# Patient Record
Sex: Female | Born: 1935 | Race: White | Hispanic: No | Marital: Married
Health system: Southern US, Community
[De-identification: ages and names within clinical notes are randomized; demographics above are authoritative.]

---

## 2003-11-12 ENCOUNTER — Ambulatory Visit: Payer: Self-pay | Admitting: General Surgery

## 2004-08-24 ENCOUNTER — Ambulatory Visit: Payer: Self-pay | Admitting: Internal Medicine

## 2005-09-28 ENCOUNTER — Ambulatory Visit: Payer: Self-pay | Admitting: Internal Medicine

## 2005-10-11 ENCOUNTER — Ambulatory Visit: Payer: Self-pay | Admitting: Internal Medicine

## 2005-10-28 ENCOUNTER — Ambulatory Visit: Payer: Self-pay | Admitting: Internal Medicine

## 2005-11-11 ENCOUNTER — Ambulatory Visit: Payer: Self-pay | Admitting: Internal Medicine

## 2006-05-06 ENCOUNTER — Emergency Department: Payer: Self-pay | Admitting: Emergency Medicine

## 2006-05-06 ENCOUNTER — Other Ambulatory Visit: Payer: Self-pay

## 2006-07-04 ENCOUNTER — Emergency Department: Payer: Self-pay | Admitting: Emergency Medicine

## 2006-12-01 ENCOUNTER — Ambulatory Visit: Payer: Self-pay | Admitting: Family Medicine

## 2007-11-21 ENCOUNTER — Inpatient Hospital Stay: Payer: Self-pay | Admitting: Internal Medicine

## 2007-12-17 ENCOUNTER — Ambulatory Visit: Payer: Self-pay | Admitting: Family Medicine

## 2008-02-10 ENCOUNTER — Emergency Department: Payer: Self-pay | Admitting: Emergency Medicine

## 2008-05-02 ENCOUNTER — Encounter: Payer: Self-pay | Admitting: Internal Medicine

## 2008-05-11 ENCOUNTER — Encounter: Payer: Self-pay | Admitting: Internal Medicine

## 2008-06-11 ENCOUNTER — Encounter: Payer: Self-pay | Admitting: Internal Medicine

## 2008-12-03 ENCOUNTER — Ambulatory Visit: Payer: Self-pay | Admitting: Family Medicine

## 2009-11-18 ENCOUNTER — Emergency Department (HOSPITAL_COMMUNITY): Admission: EM | Admit: 2009-11-18 | Discharge: 2009-11-18 | Payer: Self-pay | Admitting: Emergency Medicine

## 2010-03-04 ENCOUNTER — Emergency Department: Payer: Self-pay | Admitting: Unknown Physician Specialty

## 2010-07-14 ENCOUNTER — Ambulatory Visit: Payer: Self-pay | Admitting: Internal Medicine

## 2010-09-01 ENCOUNTER — Ambulatory Visit: Payer: Self-pay | Admitting: Cardiology

## 2010-09-03 ENCOUNTER — Ambulatory Visit: Payer: Self-pay | Admitting: Cardiology

## 2010-12-25 ENCOUNTER — Emergency Department: Payer: Self-pay | Admitting: Emergency Medicine

## 2011-06-07 ENCOUNTER — Ambulatory Visit: Payer: Self-pay | Admitting: Family Medicine

## 2011-06-14 ENCOUNTER — Ambulatory Visit: Payer: Self-pay | Admitting: General Practice

## 2011-06-14 LAB — CBC
MCHC: 33.6 g/dL (ref 32.0–36.0)
MCV: 95 fL (ref 80–100)
Platelet: 149 10*3/uL — ABNORMAL LOW (ref 150–440)
RBC: 4.27 10*6/uL (ref 3.80–5.20)
WBC: 5.1 10*3/uL (ref 3.6–11.0)

## 2011-06-14 LAB — MRSA PCR SCREENING

## 2011-06-14 LAB — URINALYSIS, COMPLETE
Bilirubin,UR: NEGATIVE
Blood: NEGATIVE
Ketone: NEGATIVE
Protein: NEGATIVE
RBC,UR: 2 /HPF (ref 0–5)
Squamous Epithelial: 1
WBC UR: 5 /HPF (ref 0–5)

## 2011-06-14 LAB — BASIC METABOLIC PANEL
BUN: 11 mg/dL (ref 7–18)
Calcium, Total: 9.3 mg/dL (ref 8.5–10.1)
Co2: 33 mmol/L — ABNORMAL HIGH (ref 21–32)
Sodium: 143 mmol/L (ref 136–145)

## 2011-06-14 LAB — PROTIME-INR
INR: 1
Prothrombin Time: 13.9 secs (ref 11.5–14.7)

## 2011-06-14 LAB — SEDIMENTATION RATE: Erythrocyte Sed Rate: 16 mm/hr (ref 0–30)

## 2011-06-15 LAB — URINE CULTURE

## 2011-07-28 ENCOUNTER — Inpatient Hospital Stay: Payer: Self-pay | Admitting: Physician Assistant

## 2011-07-29 LAB — PLATELET COUNT: Platelet: 101 x10 3/mm 3 — ABNORMAL LOW

## 2011-07-29 LAB — BASIC METABOLIC PANEL WITH GFR
Anion Gap: 6 — ABNORMAL LOW
BUN: 13 mg/dL
Calcium, Total: 8.3 mg/dL — ABNORMAL LOW
Chloride: 107 mmol/L
Co2: 30 mmol/L
Creatinine: 0.82 mg/dL
EGFR (African American): >60
EGFR (Non-African Amer.): >60
Glucose: 169 mg/dL — ABNORMAL HIGH
Osmolality: 289
Potassium: 4.5 mmol/L
Sodium: 143 mmol/L

## 2011-07-29 LAB — HEMOGLOBIN: HGB: 10.3 g/dL — ABNORMAL LOW

## 2011-07-30 LAB — BASIC METABOLIC PANEL
Anion Gap: 7 (ref 7–16)
BUN: 11 mg/dL (ref 7–18)
Chloride: 105 mmol/L (ref 98–107)
Co2: 27 mmol/L (ref 21–32)
Creatinine: 0.62 mg/dL (ref 0.60–1.30)
Potassium: 3.5 mmol/L (ref 3.5–5.1)
Sodium: 139 mmol/L (ref 136–145)

## 2011-07-30 LAB — HEMOGLOBIN: HGB: 9.2 g/dL — ABNORMAL LOW (ref 12.0–16.0)

## 2011-07-30 LAB — PLATELET COUNT: Platelet: 100 10*3/uL — ABNORMAL LOW (ref 150–440)

## 2011-12-16 ENCOUNTER — Ambulatory Visit: Payer: Self-pay | Admitting: Neurology

## 2012-04-23 ENCOUNTER — Ambulatory Visit: Payer: Self-pay | Admitting: Physician Assistant

## 2012-08-18 IMAGING — US ULTRASOUND RIGHT BREAST
1 series · 12 of 12 positions shown · non-contrast
Comparison: 12/01/2006, 08/24/2004.

REASON FOR EXAM: RT BR DISCHARGE AND YRLY
COMMENTS:

PROCEDURE:     US  - US BREAST RIGHT  - June 07, 2011  [DATE]
RESULT:

[Series 1: ultrasound right breast · 0.09mm/px · 12 of 12 slices shown]
[im 1/12]
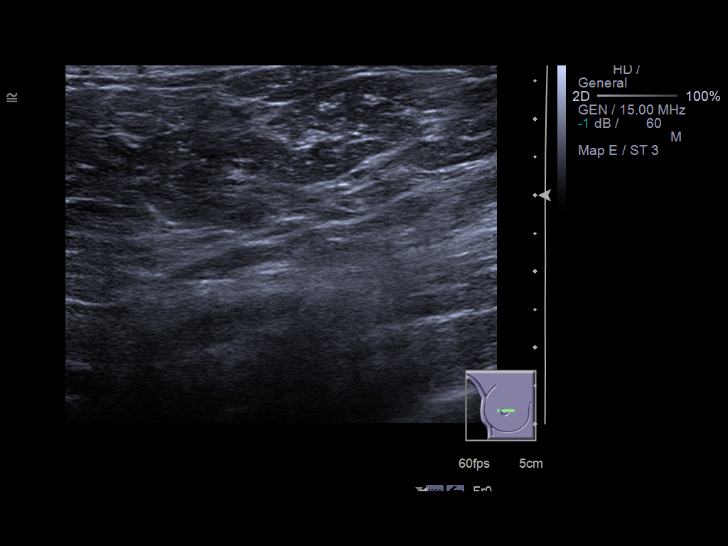
[im 2/12]
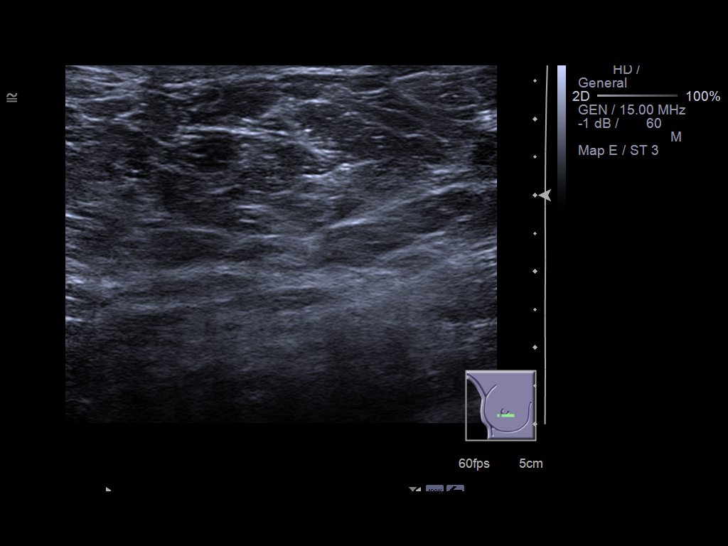
[im 3/12]
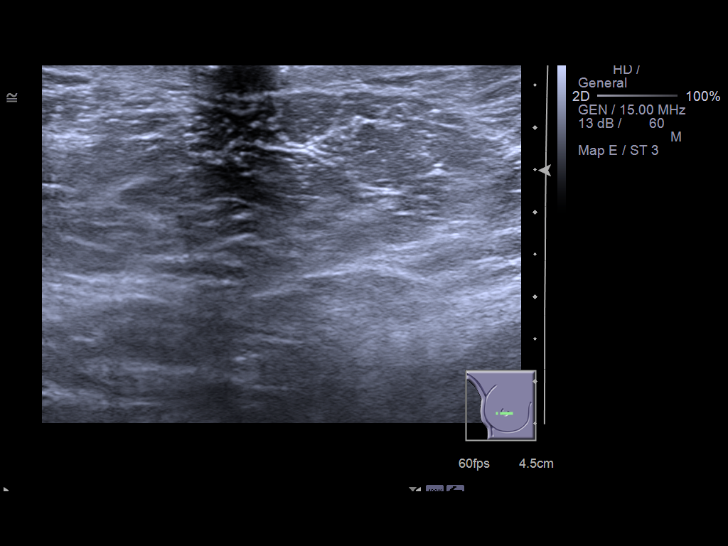
[im 4/12]
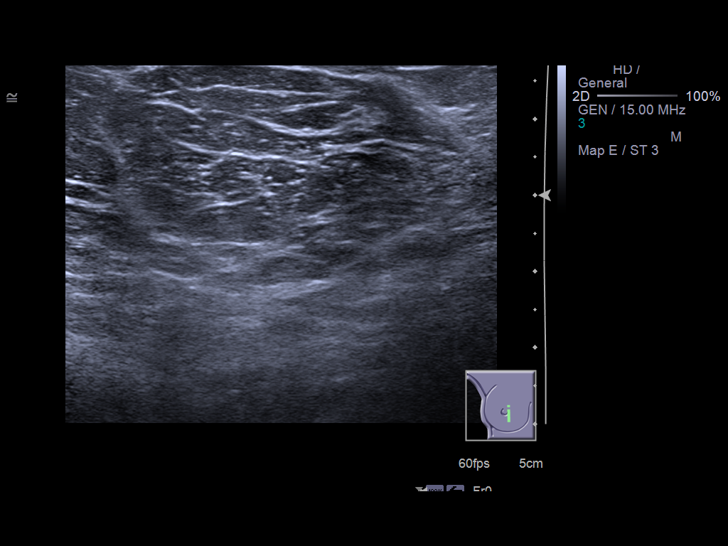
[im 5/12]
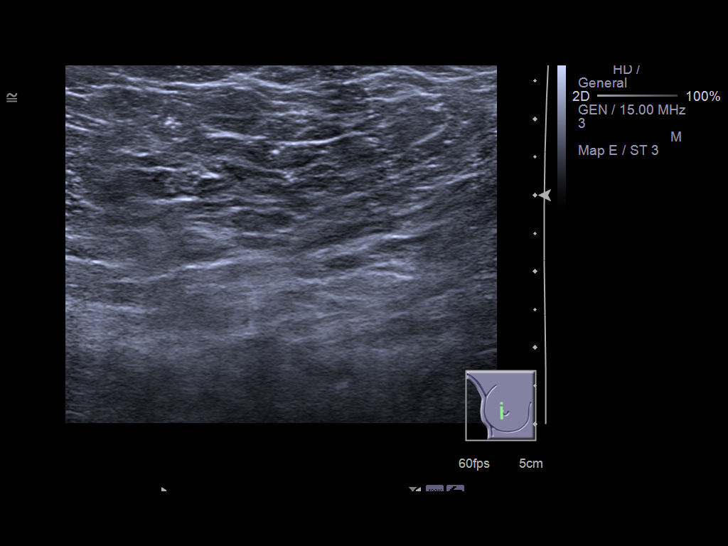
[im 6/12]
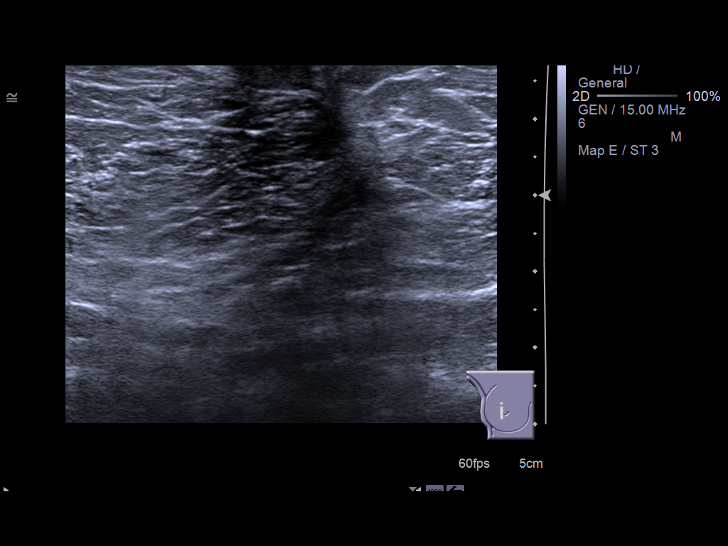
[im 7/12]
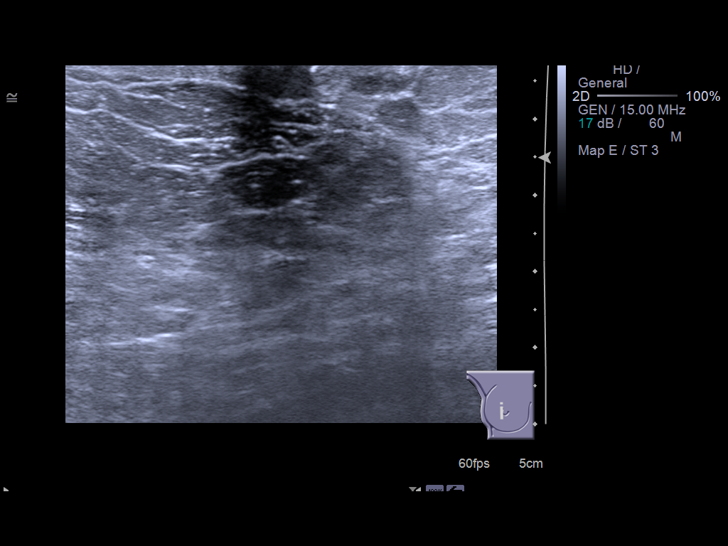
[im 8/12]
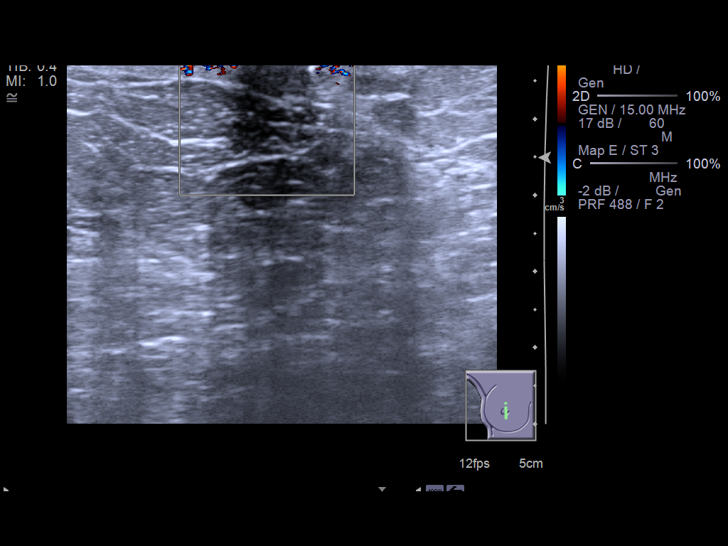
[im 9/12]
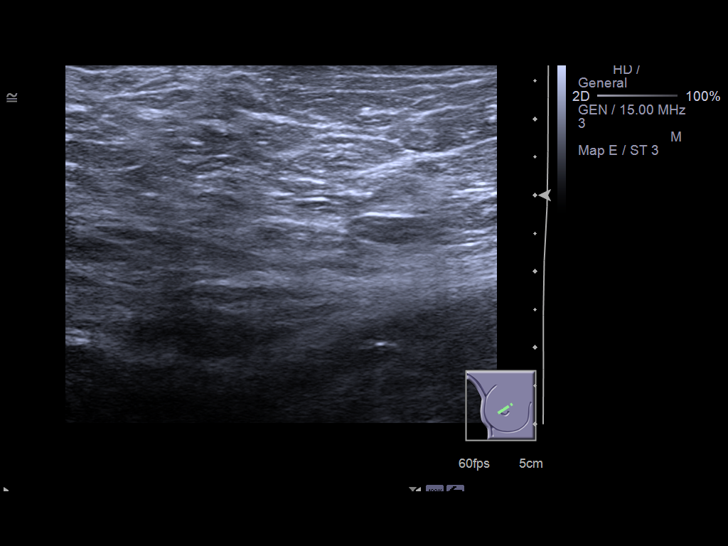
[im 10/12]
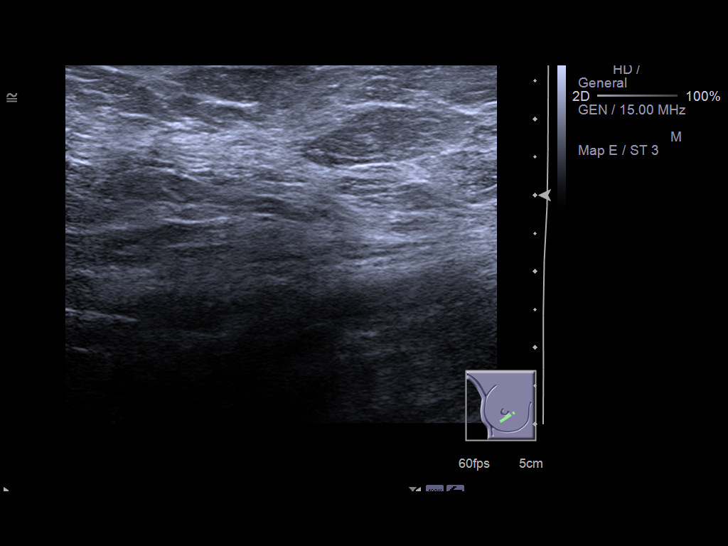
[im 11/12]
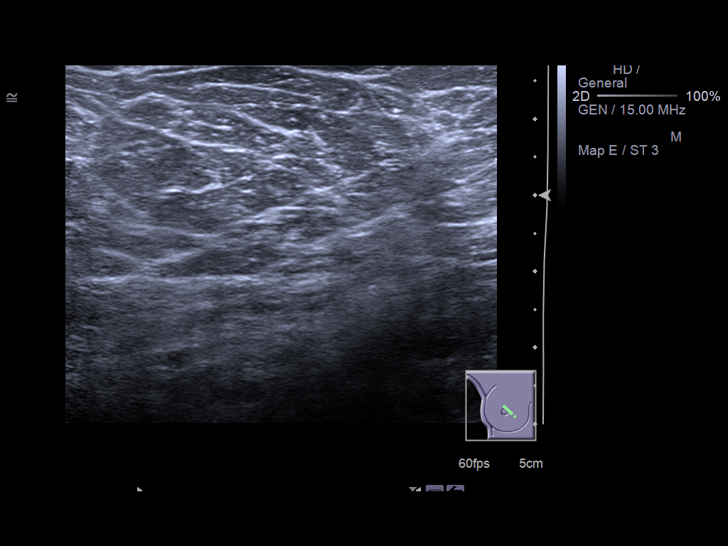
[im 12/12]
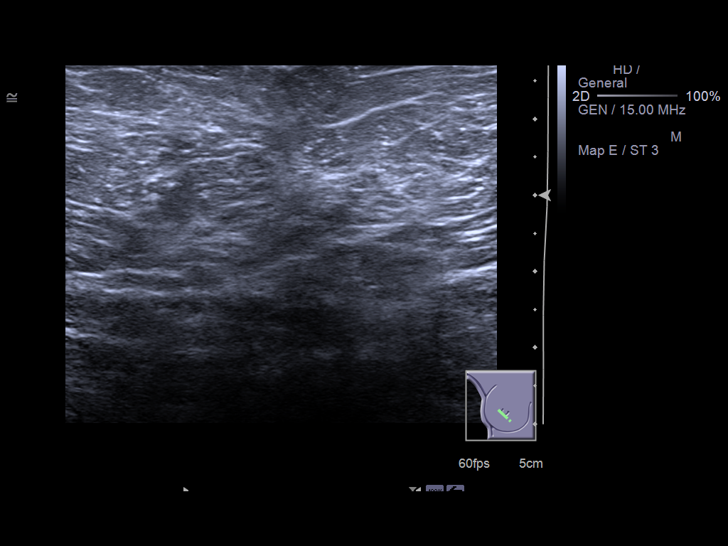

[12 of 12 positions shown; findings below may reference images not displayed]

FINDINGS: There is scattered fibroglandular tissue. A biopsy marker is present in the
lateral right breast. No suspicious masses or calcifications are identified.
No areas of architectural distortion.

Spot compression magnification views were performed of the retroareolar
right breast secondary to reported right nipple discharge. No mass or focal
asymmetry was identified.

Real-time ultrasound was performed of the retroareolar region of the right
breast. No mass or suspicious shadowing was identified. There is normal
shadowing from the nipple.
IMPRESSION: 1.  There is no sonographic or mammographic evidence of malignancy.
Recommend further evaluation and management of the reported right breast
nipple discharge be based on clinical grounds.
2.  Recommend continued annual screening mammography.

BI-RADS: Category 1 -Negative

## 2012-10-16 ENCOUNTER — Emergency Department: Payer: Self-pay | Admitting: Emergency Medicine

## 2012-10-16 LAB — COMPREHENSIVE METABOLIC PANEL WITH GFR
Albumin: 2.9 g/dL — ABNORMAL LOW
Alkaline Phosphatase: 106 U/L
Anion Gap: 4 — ABNORMAL LOW
BUN: 20 mg/dL — ABNORMAL HIGH
Bilirubin,Total: 1 mg/dL
Calcium, Total: 8.8 mg/dL
Chloride: 101 mmol/L
Co2: 30 mmol/L
Creatinine: 0.95 mg/dL
EGFR (African American): >60
EGFR (Non-African Amer.): 57 — ABNORMAL LOW
Glucose: 118 mg/dL — ABNORMAL HIGH
Osmolality: 274
Potassium: 4.5 mmol/L
SGOT(AST): 42 U/L — ABNORMAL HIGH
SGPT (ALT): 18 U/L
Sodium: 135 mmol/L — ABNORMAL LOW
Total Protein: 6.9 g/dL

## 2012-10-16 LAB — URINALYSIS, COMPLETE
Glucose,UR: NEGATIVE mg/dL (ref 0–75)
Ph: 5 (ref 4.5–8.0)
Protein: 30
RBC,UR: 3 /HPF (ref 0–5)
Specific Gravity: 1.023 (ref 1.003–1.030)
WBC UR: 4 /HPF (ref 0–5)

## 2012-10-16 LAB — CBC
HCT: 35.7 % (ref 35.0–47.0)
MCH: 31.4 pg (ref 26.0–34.0)
MCV: 93 fL (ref 80–100)
Platelet: 196 10*3/uL (ref 150–440)
RDW: 14.7 % — ABNORMAL HIGH (ref 11.5–14.5)
WBC: 7.2 10*3/uL (ref 3.6–11.0)

## 2012-10-19 ENCOUNTER — Inpatient Hospital Stay: Payer: Self-pay | Admitting: Internal Medicine

## 2012-10-19 LAB — CBC
HCT: 34.1 % — ABNORMAL LOW (ref 35.0–47.0)
MCH: 31.9 pg (ref 26.0–34.0)
MCHC: 34.7 g/dL (ref 32.0–36.0)
MCV: 92 fL (ref 80–100)
Platelet: 214 10*3/uL (ref 150–440)
RDW: 14.8 % — ABNORMAL HIGH (ref 11.5–14.5)

## 2012-10-19 LAB — COMPREHENSIVE METABOLIC PANEL
Anion Gap: 6 — ABNORMAL LOW (ref 7–16)
BUN: 34 mg/dL — ABNORMAL HIGH (ref 7–18)
Bilirubin,Total: 1.2 mg/dL — ABNORMAL HIGH (ref 0.2–1.0)
Calcium, Total: 8.9 mg/dL (ref 8.5–10.1)
EGFR (African American): 21 — ABNORMAL LOW
EGFR (Non-African Amer.): 18 — ABNORMAL LOW
Glucose: 105 mg/dL — ABNORMAL HIGH (ref 65–99)
SGOT(AST): 45 U/L — ABNORMAL HIGH (ref 15–37)
SGPT (ALT): 19 U/L (ref 12–78)

## 2012-10-19 LAB — URINALYSIS, COMPLETE
Ketone: NEGATIVE
Nitrite: NEGATIVE
RBC,UR: 4 /HPF (ref 0–5)
Specific Gravity: 1.025 (ref 1.003–1.030)
Squamous Epithelial: 4
WBC UR: 7 /HPF (ref 0–5)

## 2012-10-20 LAB — CBC WITH DIFFERENTIAL/PLATELET
Basophil #: 0 10*3/uL
Basophil %: 0.3 %
Eosinophil #: 0 10*3/uL
Eosinophil %: 0.4 %
HCT: 30.9 % — ABNORMAL LOW
HGB: 10.6 g/dL — ABNORMAL LOW
Lymphocyte %: 7.1 %
Lymphs Abs: 0.6 10*3/uL — ABNORMAL LOW
MCH: 31.5 pg
MCHC: 34.5 g/dL
MCV: 91 fL
Monocyte #: 0.5 10*3/uL
Monocyte %: 6.1 %
Neutrophil #: 7.1 10*3/uL — ABNORMAL HIGH
Neutrophil %: 86.1 %
Platelet: 202 10*3/uL
RBC: 3.38 X10 6/mm 3 — ABNORMAL LOW
RDW: 14.2 %
WBC: 8.2 10*3/uL

## 2012-10-20 LAB — COMPREHENSIVE METABOLIC PANEL WITH GFR
Albumin: 2.3 g/dL — ABNORMAL LOW
Alkaline Phosphatase: 120 U/L
Anion Gap: 7
BUN: 32 mg/dL — ABNORMAL HIGH
Bilirubin,Total: 0.9 mg/dL
Calcium, Total: 8.4 mg/dL — ABNORMAL LOW
Chloride: 101 mmol/L
Co2: 25 mmol/L
Creatinine: 1.32 mg/dL — ABNORMAL HIGH
EGFR (African American): 44 — ABNORMAL LOW
EGFR (Non-African Amer.): 38 — ABNORMAL LOW
Glucose: 108 mg/dL — ABNORMAL HIGH
Osmolality: 274
Potassium: 4.7 mmol/L
SGOT(AST): 39 U/L — ABNORMAL HIGH
SGPT (ALT): 15 U/L
Sodium: 133 mmol/L — ABNORMAL LOW
Total Protein: 6 g/dL — ABNORMAL LOW

## 2012-10-20 LAB — CA 125: CA 125: 468.2 U/mL — ABNORMAL HIGH (ref 0.0–34.0)

## 2012-10-22 LAB — BASIC METABOLIC PANEL
BUN: 13 mg/dL (ref 7–18)
Calcium, Total: 9 mg/dL (ref 8.5–10.1)
Co2: 29 mmol/L (ref 21–32)
EGFR (African American): >60
EGFR (Non-African Amer.): >60
Potassium: 4.4 mmol/L (ref 3.5–5.1)

## 2012-10-24 ENCOUNTER — Ambulatory Visit: Payer: Self-pay | Admitting: Gynecologic Oncology

## 2012-11-11 ENCOUNTER — Ambulatory Visit: Payer: Self-pay | Admitting: Gynecologic Oncology

## 2012-11-28 ENCOUNTER — Inpatient Hospital Stay: Payer: Self-pay | Admitting: Internal Medicine

## 2012-11-28 LAB — BASIC METABOLIC PANEL
BUN: 81 mg/dL — ABNORMAL HIGH (ref 7–18)
EGFR (African American): 26 — ABNORMAL LOW
EGFR (Non-African Amer.): 22 — ABNORMAL LOW
Osmolality: 279 (ref 275–301)
Potassium: 3.2 mmol/L — ABNORMAL LOW (ref 3.5–5.1)
Sodium: 126 mmol/L — ABNORMAL LOW (ref 136–145)

## 2012-11-28 LAB — CBC
HCT: 38.4 % (ref 35.0–47.0)
MCH: 30.6 pg (ref 26.0–34.0)
MCHC: 34.2 g/dL (ref 32.0–36.0)
RBC: 4.3 10*6/uL (ref 3.80–5.20)

## 2012-11-28 LAB — PRO B NATRIURETIC PEPTIDE: B-Type Natriuretic Peptide: 2456 pg/mL — ABNORMAL HIGH (ref 0–450)

## 2012-11-28 LAB — TROPONIN I
Troponin-I: <0.02 ng/mL
Troponin-I: <0.02 ng/mL
Troponin-I: <0.02 ng/mL

## 2012-11-29 LAB — BASIC METABOLIC PANEL
Anion Gap: 9 (ref 7–16)
BUN: 78 mg/dL — ABNORMAL HIGH (ref 7–18)
Chloride: 82 mmol/L — ABNORMAL LOW (ref 98–107)
EGFR (African American): 27 — ABNORMAL LOW
EGFR (Non-African Amer.): 24 — ABNORMAL LOW
Glucose: 127 mg/dL — ABNORMAL HIGH (ref 65–99)
Osmolality: 284 (ref 275–301)
Sodium: 129 mmol/L — ABNORMAL LOW (ref 136–145)

## 2012-11-29 LAB — CBC WITH DIFFERENTIAL/PLATELET
Basophil %: 0.2 %
Eosinophil #: 0.1 10*3/uL (ref 0.0–0.7)
HCT: 34.2 % — ABNORMAL LOW (ref 35.0–47.0)
HGB: 11.7 g/dL — ABNORMAL LOW (ref 12.0–16.0)
Lymphocyte #: 0.7 10*3/uL — ABNORMAL LOW (ref 1.0–3.6)
Monocyte #: 0.7 x10 3/mm (ref 0.2–0.9)
Neutrophil %: 88.2 %
Platelet: 170 10*3/uL (ref 150–440)
RBC: 3.85 10*6/uL (ref 3.80–5.20)
RDW: 16.5 % — ABNORMAL HIGH (ref 11.5–14.5)

## 2012-11-29 LAB — APTT: Activated PTT: 113.7 secs — ABNORMAL HIGH (ref 23.6–35.9)

## 2012-11-30 LAB — APTT: Activated PTT: 103.1 secs — ABNORMAL HIGH (ref 23.6–35.9)

## 2012-12-01 LAB — PLATELET COUNT: Platelet: 181 10*3/uL (ref 150–440)

## 2012-12-01 LAB — BASIC METABOLIC PANEL
BUN: 73 mg/dL — ABNORMAL HIGH (ref 7–18)
Chloride: 90 mmol/L — ABNORMAL LOW (ref 98–107)
EGFR (African American): 28 — ABNORMAL LOW
EGFR (Non-African Amer.): 24 — ABNORMAL LOW
Glucose: 110 mg/dL — ABNORMAL HIGH (ref 65–99)
Osmolality: 283 (ref 275–301)
Potassium: 4.1 mmol/L (ref 3.5–5.1)
Sodium: 130 mmol/L — ABNORMAL LOW (ref 136–145)

## 2012-12-01 LAB — MAGNESIUM: Magnesium: 2.3 mg/dL

## 2012-12-02 LAB — BASIC METABOLIC PANEL
Anion Gap: 5 — ABNORMAL LOW (ref 7–16)
BUN: 70 mg/dL — ABNORMAL HIGH (ref 7–18)
Creatinine: 1.8 mg/dL — ABNORMAL HIGH (ref 0.60–1.30)
EGFR (African American): 30 — ABNORMAL LOW
EGFR (Non-African Amer.): 26 — ABNORMAL LOW
Osmolality: 286 (ref 275–301)

## 2012-12-03 LAB — CULTURE, BLOOD (SINGLE)

## 2012-12-11 ENCOUNTER — Ambulatory Visit: Payer: Self-pay | Admitting: Gynecologic Oncology

## 2013-01-11 DEATH — deceased

## 2013-12-31 IMAGING — CT CT HEAD WITHOUT CONTRAST
1 series · 16 of 30 positions shown, 20 images · non-contrast
Comparison: none

REASON FOR EXAM: generalized weakness, near-syncope
COMMENTS:

PROCEDURE:     CT  - CT HEAD WITHOUT CONTRAST  - October 19, 2012  [DATE]
RESULT:     Technique: Helical noncontrasted 5 mm sections were obtained
from the skull base through the vertex.

[Series 2: soft tissue · axial · 0.40mm/px · z∈[-66,+69]mm · 16 of 30 slices shown, 20 images]
[im 2/30  brain]
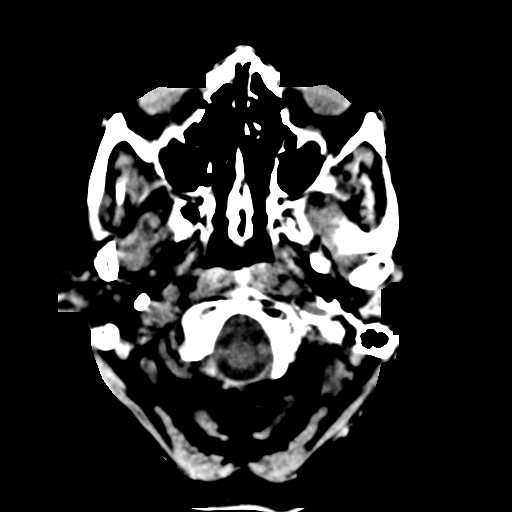
[im 2/30  bone]
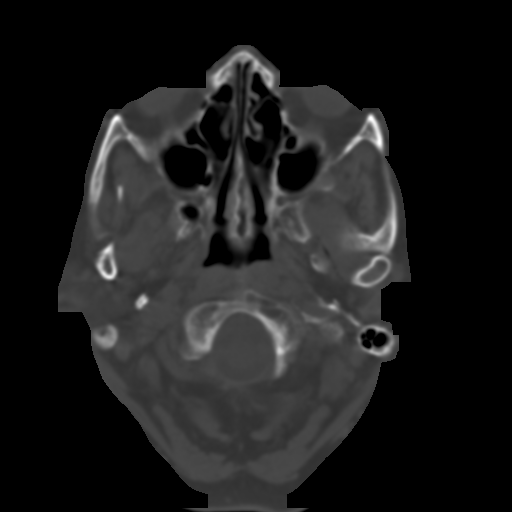
[im 4/30  brain]
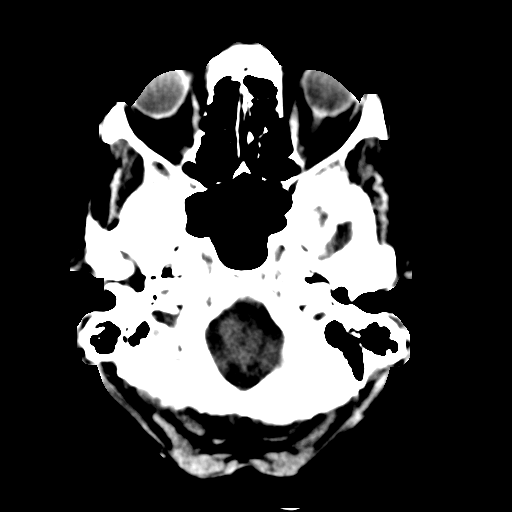
[im 6/30  brain]
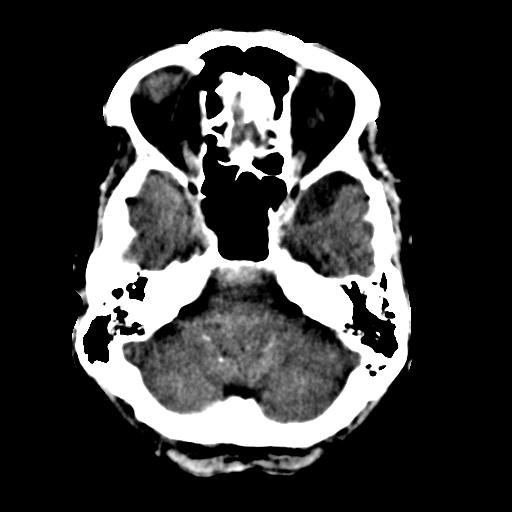
[im 8/30  brain]
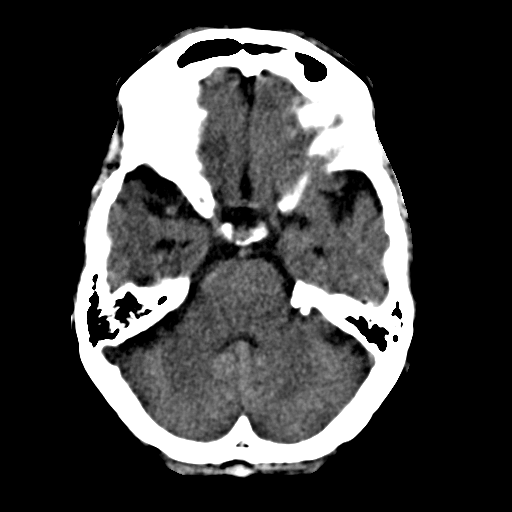
[im 9/30  brain]
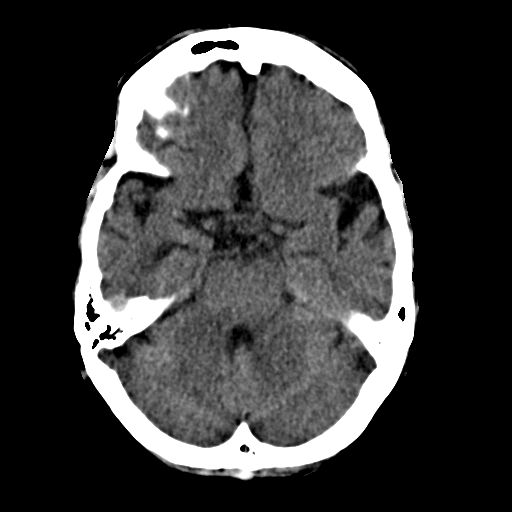
[im 9/30  bone]
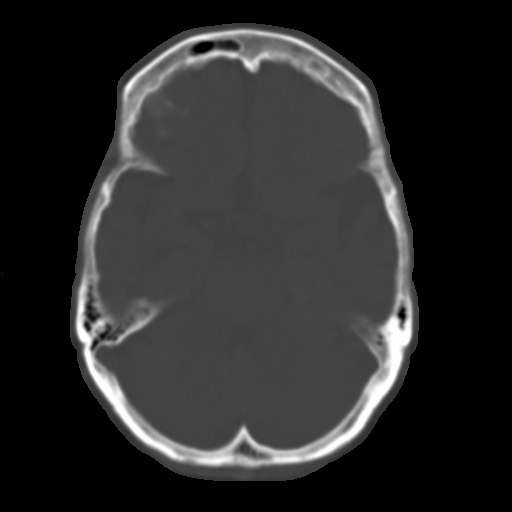
[im 11/30  brain]
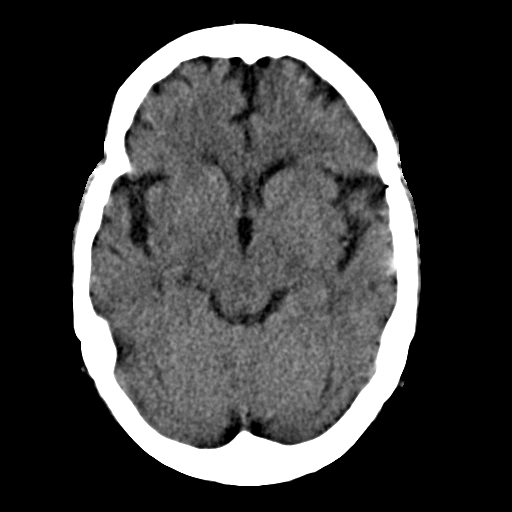
[im 13/30  brain]
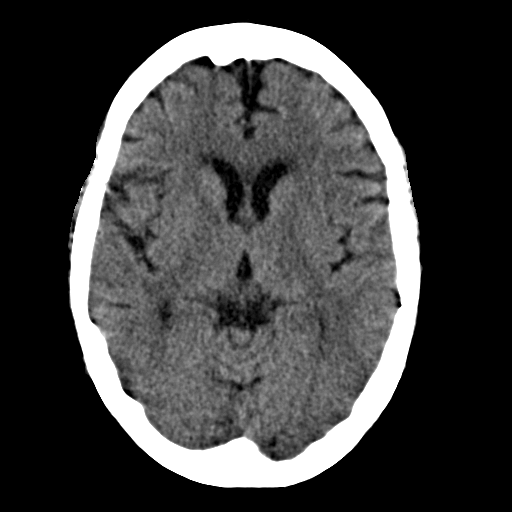
[im 15/30  brain]
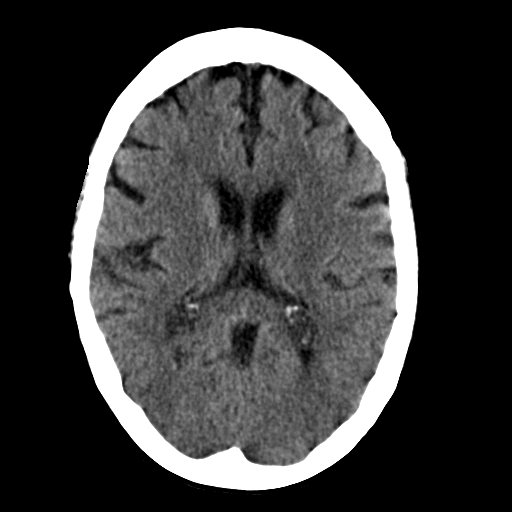
[im 16/30  brain]
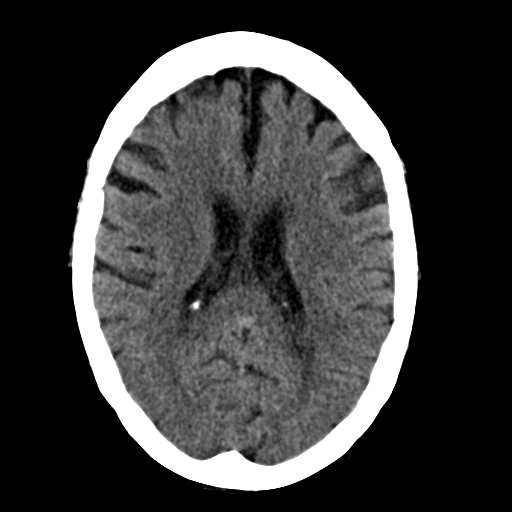
[im 16/30  bone]
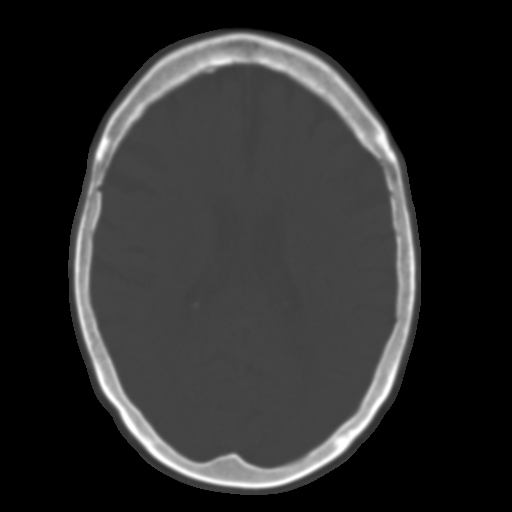
[im 18/30  brain]
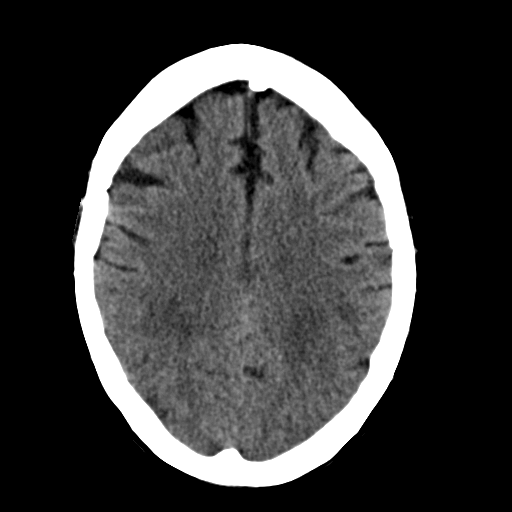
[im 20/30  brain]
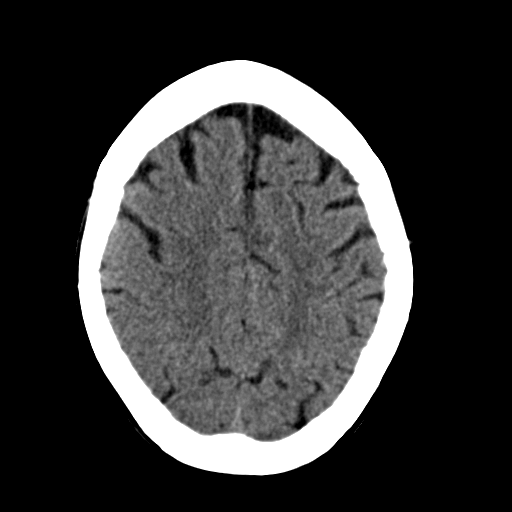
[im 22/30  brain]
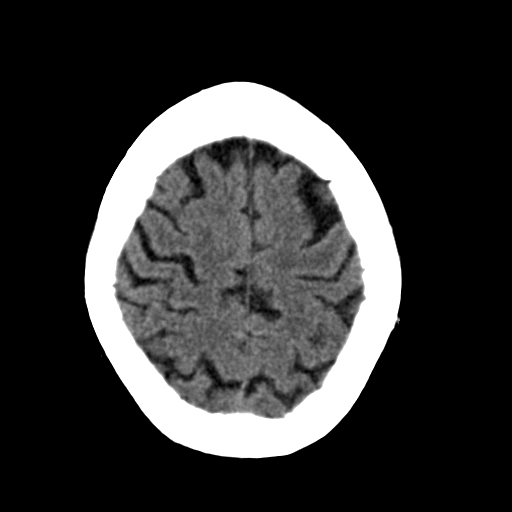
[im 23/30  brain]
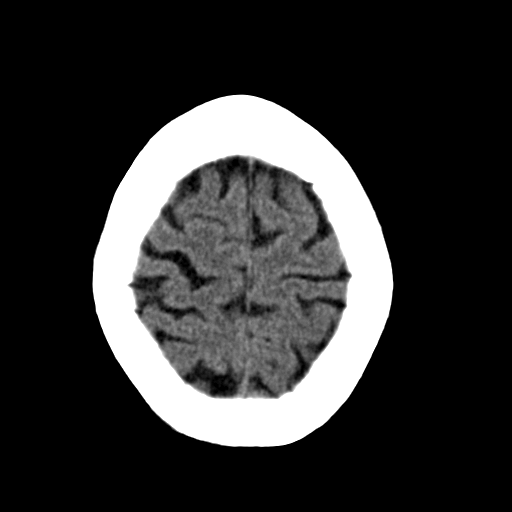
[im 23/30  bone]
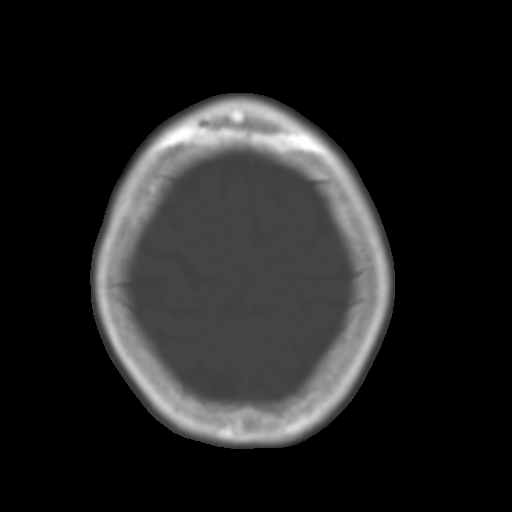
[im 25/30  brain]
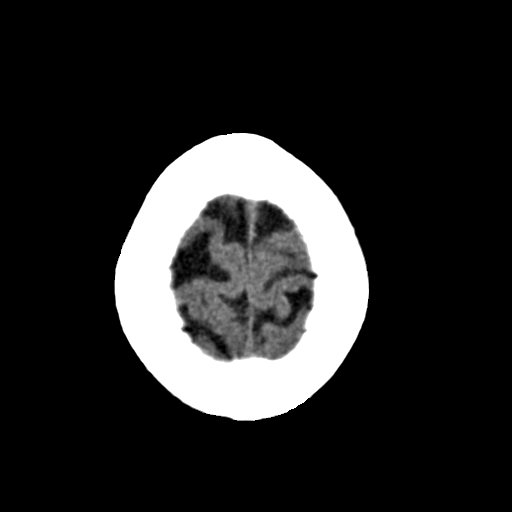
[im 27/30  brain]
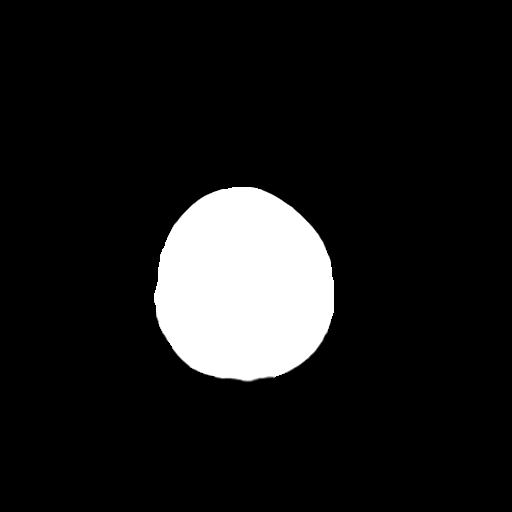
[im 29/30  brain]
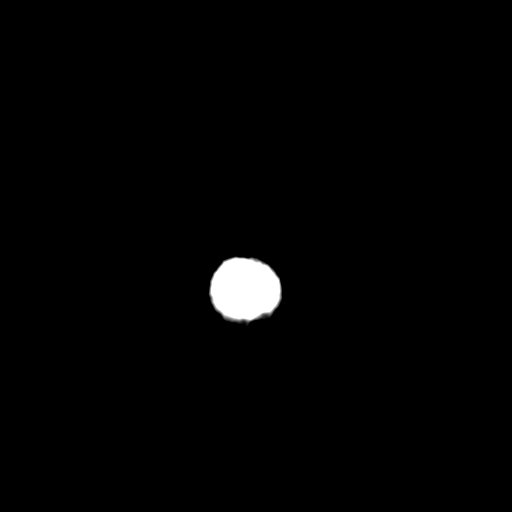

[16 of 30 positions shown; findings below may reference images not displayed]

FINDINGS: Diffuse cortical and cerebellar atrophy is identified as well as
diffuse areas of low attenuation within the subcortical, deep and
periventricular white matter regions. There is not evidence of intra-axial
nor extra-axial fluid collections, acute hemorrhage, mass effect, nor a
depressed skull fracture. The visualized paranasal sinuses and mastoid air
cells are patent.
IMPRESSION: Chronic and involutional changes without evidence of acute
abnormalities. If there is persistent clinical concern further evaluation
with MRI is recommended.
2. Comparison made to prior dated 12/16/2011

## 2014-04-30 NOTE — Discharge Summary (Signed)
PATIENT NAME:  Taylor Fox, Taylor Fox MR#:  [id number] DATE OF BIRTH:  November 12, 1929  DATE OF ADMISSION:  07/28/2011 DATE OF DISCHARGE:  08/01/2011  PRIMARY DIAGNOSIS: Degenerative arthrosis of the right knee.   SECONDARY DIAGNOSES:  1. Hypertension.  2. Hyperlipidemia.  3. Anxiety. 4. Restless leg syndrome. 5. Atrial fibrillation. 6. Stasis dermatitis. 7. Aortic valve replacement.   PROCEDURE PERFORMED: Right total knee arthroplasty using computer-assisted navigation.   BRIEF HISTORY OF PRESENT ILLNESS: The patient is a 81 year old female who has been seen for complaints of bilateral knee pain with right knee more symptomatic than the left. Pain has been aggravated by weight-bearing activities. She has had episodes of swelling as well as decrease in her range of motion. Prior to admission she was using a cane for ambulation due to the pain. She has had giving way of the knee. The pain had increased to the point that it was significantly interfering with her activities of daily living. X-rays of the right knee demonstrated severe degenerative changes in tricompartmental fashion with subcondylar sclerosis and osteophyte formation. After discussion of the risks and benefits of surgical intervention, the patient expressed her understanding of the risks, benefits, and agreed with plans for surgical intervention.   SUMMARY OF HOSPITAL COURSE: Patient underwent routine preoperative evaluation. On 07/28/2011 the patient was taken to the Operating Room at which time she underwent a right total knee arthroplasty using computer-assisted navigation under spinal anesthesia. Estimated blood loss was 200 mL. Fluids replaced included 1200 mL of crystalloid. Tourniquet time was 115 minutes. Implants utilized included DePuy PFC Sigma size 5 posterior stabilized femoral component (cemented), size 4 MBT tibial component (cemented), 38 mm three peg oval dome patella (cemented), and a 15 mm stabilized rotating platform  polyethylene insert. Soft tissue releases included anterior cruciate ligament, posterior cruciate ligament, deep medial collateral ligament, and patellofemoral ligament. The patient received perioperative antibiotics for prophylaxis. Postoperatively she received deep vein thrombosis prophylaxis in the form of Lovenox q.12 hours and early mobilization. TED stockings and AV-1 foot pumps could not be tolerated due to the patient's hypersensitivity associated with her stasis dermatitis.   Hemoglobin on postoperative day #1 was 10.3 with subsequent hemoglobin on postoperative day #2 of 9.2. The patient remained hemodynamically stable. Foley catheter and drain was discontinued on postoperative day #2. Surgical incision was healing well, although there was marked ecchymosis in the lower leg. This was felt to be primarily related to her dermatitis. Physical therapy and occupational therapy were consulted to begin total knee arthroplasty rehab protocol. The patient made slow but steady progress. She demonstrated range of motion of up to 78 degrees. She demonstrated easy fatigability with gait training but was ambulating approximately 6 feet at a time using a rolling walker with weight-bearing as tolerated. It was felt that the patient was medically and orthopedically stable. However, it was felt that she would benefit from additional physical therapy and occupational therapy in a skilled nursing environment.   DISPOSITION: The patient is discharged to [facility] skilled nursing facility in stable condition.   DIET: Regular as tolerated.   ACTIVITY: Patient may be weight-bearing as tolerated to the right lower extremity.   SPECIAL NURSING ORDERS: Incentive spirometry q.1 hour while awake. Polar Care to be continued to the right knee maintain a temperature between 40 and 50 degrees Fahrenheit.   CONSULTS: Physical therapy and occupational therapy are consulted to continue with total knee arthroplasty rehab protocol.    MEDICATIONS:  1. Tylenol 500 to 1000 mg q.4 hours  p.r.n.  2. Mylanta DS 30 mL q.6 hours p.r.n. indigestion. 3. Norvasc 5 mg orally daily.  4. Vitamin C 500 mg orally b.i.d.  5. Dulcolax suppository 10 mg per rectum daily as needed for constipation. 6. Senokot-S 1 tablet orally b.i.d.  7. Lovenox 40 mg sub-Q q.12 hours x14 days. 8. Lasix 40 mg orally daily.  9. Losartan 100 mg orally daily.  10. Lovastatin 20 mg orally daily.  11. MOM 30 mL orally b.i.d. p.r.n. constipation. 12. Omega-3 fatty acid 1 gram orally daily.  13. Artificial tears ophthalmic drops one drop both eyes q.4 hours p.r.n. dry eyes.  14. Oxycodone 5 to 10 mg orally q.4 hours p.r.n. pain.  15. Protonix 40 mg p.o. b.i.d.  16. Tramadol 50 mg to 100 mg orally q.4 hours p.r.n. pain.   FOLLOW UP: Patient is to return to the office for re-evaluation on 08/12/2011 at 8:45 a.m. to the evaluated by Van Clines, PA. Subsequent follow up is on 09/07/2011 at 1:30.   ____________________________ Illene Labrador. Angie Fava., MD jph:cms D: 08/01/2011 23:53:49 ET T: 08/02/2011 05:50:54 ET JOB#: [id number]  cc: Fayrene Fearing P. Angie Fava., MD, <Dictator> JAMES P Angie Fava MD ELECTRONICALLY SIGNED 08/02/2011 11:18

## 2014-04-30 NOTE — Discharge Summary (Signed)
PATIENT NAME:  Taylor DownerFLORENCE, Britzy G MR#:  [id number] DATE OF BIRTH:  13-Sep-1929  DATE OF ADMISSION:  07/28/2011 DATE OF DISCHARGE:  08/02/2011   ADDENDUM:  The patient was recovering from right total knee arthroplasty. She was unable to be discharged on July 21st because [facility] does not take admissions on Sundays. The patient, however, did need continued physical and occupational therapy treatments and supervised care. She was not ready to go home. She did have a stable course during her extra time here. She had tolerated her diet. On the 21st she passed some stool. She did work with physical therapy on the 21st and ambulated about 60 feet. Knee range of motion active assistive was 0 to 70 so not as good as we would like but, again, she was stable.   Her discharge medications remain the same. She will be on Lovenox 40 mg subcutaneous injections twice a day for 14 days from July 22nd and then this can be stopped. She also may take her home medicine, a cranberry capsule daily. Otherwise, her discharge medications and follow-up appointments on August 1st and 27th and everything else remains the same. Please [facility] for any questions.  ____________________________ Letta MoynahanJonathan R. Clyde CanterburyPrentice, GeorgiaPA jrp:drc D: 08/02/2011 08:32:25 ET T: 08/02/2011 08:56:13 ET JOB#: [id number]  cc: Letta MoynahanJonathan R. Clyde CanterburyPrentice, GeorgiaPA, <Dictator> Letta MoynahanJONATHAN R Desare Duddy PA ELECTRONICALLY SIGNED 08/04/2011 16:14

## 2014-04-30 NOTE — Op Note (Signed)
PATIENT NAME:  Taylor Fox, Taylor Fox MR#:  [id number] DATE OF BIRTH:  1929/09/29  DATE OF PROCEDURE:  07/28/2011  PREOPERATIVE DIAGNOSIS: Degenerative arthrosis of the right knee.   POSTOPERATIVE DIAGNOSIS: Degenerative arthrosis of the right knee.   PROCEDURE PERFORMED: Right total knee arthroplasty using computer-assisted navigation.   SURGEON: Illene LabradorJames P. Angie FavaHooten, Jr., MD   ASSISTANT: Van ClinesJon Wolfe, PA-C (required to maintain retraction throughout the procedure)   ANESTHESIA: Spinal.   ESTIMATED BLOOD LOSS: 200 mL.   FLUIDS REPLACED: 1200 mL of crystalloid.   TOURNIQUET TIME: 115 minutes.   DRAINS: Two medium drains to reinfusion system.   SOFT TISSUE RELEASES: Anterior cruciate ligament, posterior cruciate ligament, deep medial collateral ligament, and patellofemoral ligament.   IMPLANTS UTILIZED: DePuy PFC Sigma size 5 posterior stabilized femoral component (cemented), size 4 MBT tibial component (cemented), 38 mm three-peg oval dome patella (cemented), and a 15 mm stabilized rotating platform polyethylene insert.   INDICATIONS FOR SURGERY: The patient is a 81 year old female who has been seen for complaints of severe progressive right knee pain with flexion contracture and varus deformity. X-rays demonstrated severe degenerative changes in tricompartmental fashion with erosion noted medially. The patient has not seen any significant improvements despite conservative nonsurgical intervention. After discussion of the risks and benefits of surgical intervention, the patient expressed her understanding of the risks and benefits and agreed with plans for surgical intervention.  PROCEDURE IN DETAIL: The patient was brought into the operating room, and after adequate spinal anesthesia was achieved, a tourniquet was placed on the patient's upper right thigh. The patient's right knee and leg were cleaned and prepped with alcohol and DuraPrep and draped in the usual sterile fashion. A "timeout" was  performed as per usual protocol. The right lower extremity was exsanguinated using Esmarch, and the tourniquet was inflated to 300 mmHg. An anterior longitudinal incision was made followed by a standard mid vastus approach. A moderate effusion was evacuated. The deep fibers of the medial collateral ligament were elevated in a subperiosteal fashion off the medial flare of the tibia so as to maintain a continuous soft tissue sleeve. The patella was subluxed laterally and the patellofemoral ligament was incised.. Inspection of the knee demonstrated severe degenerative changes in all three compartments with full-thickness loss of articular cartilage and eburnated bone noted to the medial compartment. Prominent osteophytes were debrided using a rongeur. The remnant of the anterior and posterior cruciate ligaments were excised. Two 4.0 mm Schanz pins were inserted into the femur and into the tibia for attachment of the array of spheres used for computer-assisted navigation. Hip center was identified using circumduction technique. Distal landmarks were mapped using the computer. The distal femur and proximal tibia were mapped using the computer. The distal femoral cutting guide was positioned using computer-assisted navigation so as to achieve a 5-degrees distal valgus cut. Cut was performed and verified using the computer. A relatively poor bone quality was noted. The distal femur was sized, and it was felt that a size 5 femoral component was appreciated. A size 5 cutting guide was positioned and an anterior cut was performed and verified using the computer. This was followed by completion of the posterior and chamfer cuts. A femoral cutting guide for the central box was then positioned, and the central box cut was performed.   Attention was then directed to the proximal tibia. Medial and lateral menisci were excised. The extramedullary tibial cutting guide was positioned using computer-assisted navigation so as to  achieve 0-degree varus-valgus alignment and  0-degree posterior slope. Cut was performed and verified using the computer. The proximal tibia was sized, and it was felt that a size 4 tibial tray was appropriate. At this point, there was noted to be a skin tear along the inferior aspect of the incision site. A Velcro strap was placed over the area so as to prevent propagation of the tear. Tibial and femoral trials were inserted, followed by insertion of first a 10 and subsequently a 12.5 and eventually a 15 mm polyethylene insert. This allowed for excellent mediolateral soft tissue balancing both in extension and in flexion. Finally, the patella was cut and prepared so as to accommodate a 38 mm three-peg oval dome patella. A patellar trial was placed and the knee was placed through a range of motion with excellent patellar tracking appreciated. The femoral trial was removed after debridement of osteophytes from the posterior condyles. A central post hole for the tibial component was reamed, followed by insertion of a keel punch. The tibial trial was then removed. The cut surfaces of bone were irrigated with copious amounts of normal saline with antibiotic solution using pulsatile lavage and then suctioned dry. Polymethylmethacrylate cement with gentamicin was mixed in the usual sterile fashion using a vacuum mixer. Cement was applied to the cut surface of the proximal tibia as well as along the undersurface of a size 4 MBT tibial component. The tibial component was positioned and impacted into place. Excess cement was removed using freer elevators. Cement was then applied to the cut surface of the femur as well as along the posterior flanges of a size 5 posterior stabilized femoral component. The femoral component was positioned and impacted into place. Excess cement was removed using freer elevators. A 15 mm polyethylene trial was inserted and the knee was brought in full extension with steady axial compression  applied. Finally, cement was applied to the backside of a 38 mm three-peg oval dome patella, and the patellar component was positioned and a patellar clamp applied. Excess cement was removed using freer elevators.  After adequate curing of cement, the tourniquet was deflated after a total tourniquet time of 115 minutes. Hemostasis was achieved using electrocautery. The knee was irrigated with copious amounts of normal saline with antibiotic solution using pulsatile lavage and then suctioned dry. The knee was inspected for any residual cement debris; 30 mL of 0.25% Marcaine with epinephrine was injected along the posterior capsule. A 15 mm stabilized rotating platform polyethylene insert was inserted, and the knee was placed through a range of motion. Excellent patellar tracking was appreciated and excellent mediolateral soft tissue balancing was appreciated.   Two medium drains were placed in the wound bed and brought out through a separate stab incision to be attached to a reinfusion system. The medial parapatellar portion of the incision was reapproximated using interrupted sutures of #1 Vicryl. The subcutaneous tissue was approximated in layers first using 0 Vicryl, followed by 2-0 Vicryl. The area of the skin tear was carefully reapproximated using 2-0 Vicryl. Skin as well as skin tear was then reapproximated using skin staples. A sterile dressing was applied.   The patient tolerated the procedure well. She was transported to the recovery room in stable condition.   ____________________________ Illene Labrador. Angie Fava., MD jph:cbb D: 07/29/2011 00:32:50 ET T: 07/29/2011 10:46:19 ET JOB#: [id number]  cc: Fayrene Fearing P. Angie Fava., MD, <Dictator> JAMES P Angie Fava MD ELECTRONICALLY SIGNED 08/01/2011 8:13

## 2014-05-03 NOTE — H&P (Signed)
PATIENT NAME:  Taylor DownerFLORENCE, Taylor Fox MR#:  [id number] DATE OF BIRTH:  26-May-1929  DATE OF ADMISSION:  11/28/2012  PRIMARY CARE PHYSICIAN: Dr. Maryjane HurterFeldpausch, but the patient is currently at [facility].   CHIEF COMPLAINT: Sent in for low oxygen saturation.   HISTORY OF PRESENT ILLNESS: This is an 81 year old female who is a very poor historian, most of the history obtained from previous chart. She was sent in for low oxygen saturation. She does not complain of any shortness of breath. No chest pain. She does have a dry cough. She has been basically bedbound for the past month, very weak. In the ER, she did have a chest x-ray done which showed pleural effusions, bibasilar air space disease. Differential includes pneumonia, asymmetric edema or atelectasis. The patient also had a CT scan of the chest without contrast that showed a small moderate right pleural effusion, left basilar air space disease, patchy bilateral ground-glass opacities concerning for mild edema, and abdominal ascites. The patient was also found to be in acute renal failure, had an elevated white count, hyponatremia and hypokalemia. Hospitalist services were contacted for further evaluation.   PAST MEDICAL HISTORY: Hypertension, chronic atrial fibrillation, hyperlipidemia, coronary artery disease, congestive heart failure, neuropathy, left ovarian cystic mass, high-risk surgical candidate, dementia.   PAST SURGICAL HISTORY: Aortic valve replacement, coronary artery bypass graft, cystocele and rectocele repair, cholecystectomy, and pacemaker placement.   ALLERGIES: No known drug allergies.   SOCIAL HISTORY: Quit smoking 30 years ago. No alcohol. No drug use. Currently at the rehab.   FAMILY HISTORY: Father died after a fracture of the neck. Mother died of a stroke in her 3240s.   MEDICATIONS: Include Dulcolax suppository as needed for constipation, cranberry 1 tablet daily, Demadex 100 mg daily, fish oil 1200 mg daily, gabapentin 300  mg twice a day, multivitamin with minerals once a day, tramadol 50 mg every 6 hours as needed for pain, vitamin C 500 mg twice a day, Zofran 4 mg every 6 hours as needed for nausea, vomiting.   REVIEW OF SYSTEMS:    CONSTITUTIONAL: No fever, chills, or sweats. Positive for weakness. No weight gain. No weight loss.  EYES: She does wear glasses.  EARS, NOSE, MOUTH, AND THROAT: Decreased hearing. No sore throat. No difficulty swallowing.  CARDIOVASCULAR: No chest pain. No palpitations.  RESPIRATORY: No shortness of breath. Positive for cough. No sputum. No hemoptysis.  GASTROINTESTINAL: Positive for nausea. Positive for abdominal pain. No diarrhea. No constipation. No bright red blood per rectum. No melena.  GENITOURINARY: No burning on urination. No hematuria.  MUSCULOSKELETAL: Positive for edema and skin breakdown on the legs.  SKIN: Positive for skin tears on the legs.  PSYCHIATRIC: No anxiety or depression.  ENDOCRINE: No thyroid problems.  HEMATOLOGIC AND LYMPHATIC: No anemia.   PHYSICAL EXAMINATION: VITAL SIGNS: On presentation to the ER included a pulse oximetry of 85% on room air, blood pressure 131/60, temperature 98, pulse 88, respirations 18.  GENERAL: No respiratory distress, lying flat in bed.  EYES: Conjunctivae and lids normal. Pupils equal, round, and reactive to light. Extraocular muscles intact. No nystagmus.  EARS, NOSE, MOUTH, AND THROAT: Tympanic membranes: No erythema. Nasal mucosa: No erythema. Throat: No erythema. No exudate seen. Lips and gums: No lesions.  NECK: No JVD. No bruits. No lymphadenopathy. No thyromegaly. No thyroid nodules palpated.  RESPIRATORY: Decreased breath sounds bilaterally. Poor air entry. Scattered rhonchi at lung bases.  CARDIOVASCULAR: S1, S2 normal. Positive II/VI systolic ejection murmur. Carotid upstroke 2+ bilaterally.  No bruits. Dorsalis pedis pulses difficult to palpate secondary to 4+ edema of the lower extremity.  ABDOMEN: Soft. Positive  large mass, left lower quadrant and coming over the midline of the abdomen, painful to palpation. No organomegaly/splenomegaly. Normoactive bowel sounds.  LYMPHATIC: No lymph nodes in the neck.  MUSCULOSKELETAL: There is 4+ edema. No clubbing, no cyanosis on oxygen. SKIN: Bilateral lower extremity discoloration. Left lower extremity: Large skin tear and ulceration over the left anterior surface measuring about 10 cm x 6 cm with surrounding erythema. Right lower extremity: Two areas of skin tear with weeping edema with surrounding erythema.  PSYCHIATRIC: Answers some yes or no questions. Oriented to person and place.  NEUROLOGIC: Cranial nerves II through XII grossly intact. Deep tendon reflexes difficult to elicit.   LABORATORY AND RADIOLOGICAL DATA: CT scan of the chest shows small to moderate right pleural effusion with compression atelectasis, left basilar airspace disease with trace left pleural effusion, bilateral ground-glass opacities, moderate abdominal ascites. Chest x-ray shows pleural effusions, bibasilar airspace opacities; differential includes pneumonia, asymmetric edema versus atelectasis. White blood cell count 15.8, hemoglobin and hematocrit 13.1 and 38.4, platelet count of 187. Troponin negative. Glucose 127, BUN 81, creatinine 2.05, sodium 126, potassium 3.2, chloride 82, CO2 of 38. GFR 22. BNP 2456. Previous creatinine upon discharge last time was 0.75.   ASSESSMENT AND PLAN: 1.  Acute respiratory failure: Will give oxygen supplementation for her pulse oximetry of 85% on room air and continue to monitor closely. The patient is a full code at this point in time.  2.  Pneumonia: The patient is in a nursing home facility. The patient does have a leukocytosis, potential cellulitis. Will get aggressive with antibiotics with Levaquin, Zosyn. and add vancomycin. Follow up blood cultures at this time.  3.  Weeping edema: This could be worsened with the possibility of cellulitis. At this  point, I think the patient is intravascularly dry, so I do have to give IV fluids and hold on the Demadex.  4.  Acute renal failure with hyponatremia: Will give gentle IV fluid hydration since the patient does have a history of heart failure. No signs of heart failure at this point. Hold the Demadex.  5.  Hypokalemia: Will replace potassium orally and IV in the IV fluids.  6.  Large ovarian mass that is palpated on exam, ascites seen on CAT scan: This could be metastatic ovarian cancer. Overall prognosis is poor. The patient is a full code. I will get palliative care consultation.  7.  History of atrial fibrillation and coronary artery disease.  8.  History of hyperlipidemia.  9.  History of neuropathy.   TIME SPENT ON ADMISSION: 55 minutes.   CODE STATUS: The patient is a full code.    ____________________________ Herschell Dimes. Renae Gloss, MD rjw:jcm D: 11/28/2012 16:58:21 ET T: 11/28/2012 17:36:29 ET JOB#: [id number]  cc: Herschell Dimes. Renae Gloss, MD, <Dictator> Salley Scarlet MD ELECTRONICALLY SIGNED 11/28/2012 18:58

## 2014-05-03 NOTE — Consult Note (Signed)
PATIENT NAME:  Taylor Fox, Taylor Fox MR#:  [id number] DATE OF BIRTH:  [birth year]  DATE OF CONSULTATION:  10/19/2012  REQUESTING PHYSICIAN:  Dr. Delfino LovettVipul Shah CONSULTING PHYSICIAN:  Suzy Bouchardhomas J. Schermerhorn, MD  REASON FOR CONSULT:  Female with a large ovarian mass.   HISTORY OF PRESENT ILLNESS: This is an 81 year old female admitted to [facility] in acute renal failure today. CT scan was performed on 10/06 which showed a large right ovarian mass measuring 14.6 x 15 cm complex. The patient denies bloating, nausea or vomiting, recent weight loss. The patient is a poor historian, does not know why she came to the hospital; got admitted to the hospital today.   PAST MEDICAL HISTORY: Hypertension, hyperlipidemia, aortic valve replacement and pacemaker.   PAST SURGICAL HISTORY:  Aortic valve replacement, cholecystectomy, bladder tack procedure.   REVIEW OF SYSTEMS: Unremarkable, other than above. The patient does not smoke, does not drink.   MEDICATIONS:  Gabapentin 300 mg b.i.d., hydroxyzine, Zofran, pantoprazole tablet 40 mg, tramadol.   FAMILY HISTORY: No gynecologic cancers.   PHYSICAL EXAMINATION: GENERAL: Well-developed, slightly disoriented female, obese.  Temperature 97.8, blood pressure 105/69, pulse 92.  LUNGS: Clear to auscultation. No rales or wheezes.  CARDIOVASCULAR: 2/6 systolic ejection murmur. ABDOMEN: Obese, nondistended, nontender. No palpable mass. PELVIC:  Bedside pelvic exam performed with nurse in room. Cervix: No palpable lesions. Uterus:  Small, slight fullness to the right, nontender. RECTOVAGINAL:  Exam is deferred.   ASSESSMENT: Large complex ovarian mass seen on CT concerning for ovarian cancer. The patient has multiple medical problems including acute renal failure.   PLAN: 1.  Recommend performing pelvic and abdominal ultrasound to better identify the specifics of this mass. 2.  CA-125 level. 3. Ultimately, the patient is going to need exploration  of this mass to rule in or rule out malignant process. She most likely is going to be best served at a tertiary facility given her medical history.   ____________________________ Suzy Bouchardhomas J. Schermerhorn, MD tjs:ce D: 10/19/2012 14:54:54 ET T: 10/19/2012 15:44:04 ET JOB#: [id number]  cc: Suzy Bouchardhomas J. Schermerhorn, MD, <Dictator> Vipul S. Sherryll BurgerShah, MD Suzy BouchardHOMAS J SCHERMERHORN MD ELECTRONICALLY SIGNED 10/30/2012 22:09

## 2014-05-03 NOTE — H&P (Signed)
PATIENT NAME:  Taylor Fox, Taylor Fox MR#:  [id number] DATE OF BIRTH:  [birth year]  DATE OF ADMISSION:  10/19/2012  PRIMARY CARE PHYSICIAN: Dr. Maudie Flakes, at [facility], [place].   REQUESTING PHYSICIAN: Dr. Delrae Alfred.   CHIEF COMPLAINT: Weakness and pain in her abdomen.   HISTORY OF PRESENT ILLNESS: The patient is a 81 year old female with a known history of hypertension, chronic atrial fibrillation is being admitted for acute renal failure thought to be due to dehydration. The patient was in the emergency department about 3 days ago for abdominal pain when she underwent CT scan of the abdomen and was found to have possible ovarian malignancy with large complex cystic ovarian mass, for which she was referred to OB/GYN and she was scheduled to see Dr. Logan Bores today, but she kept getting worse over the last three days with not able to eat or drink and was very weak, fell this morning and came back to the Emergency Department. While in the ED, she was found to have acute renal failure with a creatinine of 2.48, which was normal with a value of 0.95 three days ago. When I saw her, she was quite sleepy, but was easily able to wake up and provided most of the information with her daughter and husband, who is at the bedside. She denies any shortness of breath, chest pain, cough, fever, but having significant difficulty moving around in the house which she normally does with walker and/or cane. She is also complaining of right lower quadrant and hip pain.   PAST MEDICAL HISTORY: 1. Hypertension.  2. Chronic atrial fibrillation.  3. Hyperlipidemia.  4. Atherosclerotic cardiovascular disease.  5. Congestive heart failure.  6. Neuropathy.   PAST SURGICAL HISTORY: 1. Aortic valve replacement in 2006. 2. Coronary artery bypass graft.  3. Cystocele/rectocele repair.  4. Cholecystectomy.  5. Pacemaker placement.   ALLERGIES: No known drug allergies.   MEDICATIONS AT HOME:  1. Amlodipine 5 mg  p.o. daily.  2. Aspirin 81 mg p.o. at bedtime.  3. Bufferin 250 mg p.o. at bedtime.  4. Cholestin once daily.  5. Cranberry once daily.  6. Daily multivitamin for women once daily.  7. Dulcolax 10 mL once daily per rectum as needed.  8. Fish oil 1200 mg p.o. daily.  9. Lasix 40 mg p.o. daily.  10. Gabapentin 300 mg p.o. b.i.d.  11. Hydroxyzine 10 mg p.o. daily.  12. Losartan 100 mg p.o. daily.  13. Maalox 30 mL p.o. as needed.  14. Namenda 5 mg p.o. daily.  15. Zofran 8 mg p.o. daily.  16. Protonix 40 mg p.o. daily.  17. Tramadol 50 mg p.o. every 4 hours as needed.  18. Vitamin C 500 mg p.o. b.i.d.   SOCIAL HISTORY: No smoking. No alcohol. She lives with her husband.   FAMILY HISTORY: Mother died of stroke in 89s. A lot of her  family has died at an early age but does not recall family history and medical problems.   REVIEW OF SYSTEMS: CONSTITUTIONAL: No fever. Positive for fatigue and weakness. Denies any weight loss.  EYES: No blurred or double vision.  ENT: No tinnitus or ear pain.  RESPIRATORY: No cough or hemoptysis.  CARDIOVASCULAR: No chest pain, orthopnea, edema.  GASTROINTESTINAL: Positive for right lower quadrant abdominal pain. Positive for some nausea, no vomiting, very poor appetite and not able to eat due to constant nausea and/or dry heaves.  GENITOURINARY: No dysuria, hematuria.  ENDOCRINE: No polyuria or nocturia.  HEMATOLOGY: No anemia or  easy bruising.  SKIN: No rash or lesion.  MUSCULOSKELETAL: Positive for arthritis and right hip pain radiating pain from her buttocks to right leg.  NEUROLOGIC: No tingling, numbness, weakness.  PSYCHIATRIC: No history of anxiety or depression.   PHYSICAL EXAMINATION: VITAL SIGNS: Temperature 98.5, heart rate 82 per minute, respirations 18 per minute, blood pressure 177/127 mmHg on repeat check was 108/56 mmHg. She is saturating 91% on room air.  GENERAL: The patient is an 81 year old female lying in the bed comfortably  without any acute distress.  EYES: Pupils equal, round, reactive to light and accommodation. No scleral icterus. Extraocular muscles intact.  HEENT: Head, normocephalic. Oropharynx and nasopharynx clear.  NECK: Supple, no jugular venous distention, no thyromegaly enlargement or tenderness. LUNGS: Clear to auscultation bilaterally. No wheezes, rales, rhonchi. CARDIOVASCULAR: S1, S2 normal. No murmur, rubs, gallop. ABDOMEN: Soft, minimal tenderness in the right lower quadrant, nondistended, obese. No organomegaly appreciated, bowel sounds present.  NEUROLOGIC: Cranial nerves III through XII intact. Muscle strength 4/5 in all extremities. Sensation intact. Gait not checked.  PSYCHIATRIC: The patient is alert and oriented x 3.  SKIN: She has some depigmentation in the lower extremities below her knee.  LOWER EXTREMITIES: Does not have any pitting edema, cyanosis or clubbing.   LABORATORY DATA: Normal CBC except hemoglobin of 11.8, hematocrit 34.1.   LFTs showed alkaline phosphatase of 145, AST of 45, total bilirubin of 1.2. BNP showed BUN of 34, creatinine 2.48, a sodium of 135.   Urinalysis showed no bacteria, 7 WBCs, otherwise negative.   CT scan of the head without contrast in the ED showed no acute intracranial pathology.   Chest x-ray shows no acute cardiopulmonary disease.   EKG shows atrial fibrillation with a rate of 90 per minute, left axis deviation, no major ST-T changes.   IMPRESSION AND PLAN: 1. Acute renal failure, likely prerenal in nature as her kidney function was normal two days ago. She has not been eating or drinking for the last 2 to 3 days which has likely contributed along with Lasix. We will hold Lasix and hold any nephrotoxin. Start her on aggressive IV hydration with caution considering her underlying heart failure.  2. Possible ovarian malignancy based on recent CT abdomen. We will consult OB for evaluation. Family would like to get surgery if needed while here in the  hospital.  3. Difficulty ambulation with recent fall this morning, likely due to her Hip pain with  degenerative joint disease of the hips and overall deconditioning. We will get physical therapy evaluation and management. She will likely need placement 4. Chronic atrial fibrillation not on any kind of anticoagulation. Followed by Dr. Gwen PoundsKowalski. Her rate is well controlled. We will hold off any further starting of anticoagulation as she was reported to have atrial fibrillation in 2001 also. She did have aortic valve replaced and pacemaker placed. Consider cardiology consultation if needed.  5. Hypertension: Blood pressure seems fairly controlled. We will hold Lasix and other blood pressure medication as her last blood pressure was 108/56.  6. Elevated alkaline phosphatase. We will recheck in the morning with hydration.  7. CODE STATUS: FULL CODE.   TOTAL TIME TAKING CARE OF THIS PATIENT:  55 minutes.    ____________________________ Ellamae SiaVipul S. Sherryll BurgerShah, MD vss:sg D: 10/19/2012 11:31:56 ET T: 10/19/2012 12:58:45 ET JOB#: [id number]  cc: Viaan Knippenberg S. Sherryll BurgerShah, MD, <Dictator> Marina Goodellale E. Feldpausch, MD   Ellamae SiaVIPUL S [facility] MD ELECTRONICALLY SIGNED 10/20/2012 13:23

## 2014-05-03 NOTE — Discharge Summary (Signed)
PATIENT NAME:  Taylor Fox, Taylor Fox MR#:  [id number] DATE OF BIRTH:  Dec 13, 1929  DATE OF ADMISSION:  11/28/2012 DATE OF DISCHARGE:  12/02/2012  ADMITTING PHYSICIAN:  Alford Highland, MD   DISCHARGING PHYSICIAN:  Enid Baas, MD   PRIMARY PHYSICIAN:  The physician currently at [facility]  CONSULTATIONS IN THE HOSPITAL:  Palliative Care consultation with Dr. Alison Murray.  DISCHARGE DIAGNOSES: 1.  Acute respiratory failure secondary to pneumonia. 2.  Acute renal failure. 3.  Healthcare-acquired pneumonia. 4.  Right lower extremity deep venous thrombosis. 5.  Peripheral neuropathy. 6.  Likely metastatic ovarian cancer. 7.  Severe ascites. 8.  Cellulitis with bilateral chronic lower extremity wounds. 9.  Coronary artery disease status post bypass graft surgery. 9.  Aortic valve replacement. 10.  Diastolic congestive heart failure. 11.  Chronic atrial fibrillation. 12.  Dementia.  DISCHARGE MEDICATIONS: 1.  Gabapentin 300 mg p.o. b.i.d.  2.  Bisacodyl 10 mg suppository once a day as needed for constipation.  3.  Levaquin 500 mg p.o. daily for 6 days.  4.  Multivitamin 1 tablet p.o. daily. 5.  Vitamin C 500 mg p.o. b.i.d.  6.  Eliquis 10 mg p.o. b.i.d. for 7 days followed by: 7.  Eliquis 5 p.o. b.i.d. after the first 7 days. 8.  Roxanol 20 mg/mL oral concentrate, 0.25 mL q.6 hours p.r.n. for pain or shortness of breath.   HOME OXYGEN:  2 L by nasal cannula.  DISCHARGE DIET:  Low sodium diet.  DISCHARGE ACTIVITY:  As tolerated.  FOLLOW UP INSTRUCTIONS: 1.  PCP follow up at the facility in 1 week. 2.  Physical Therapy. 3.  Once the patient is transferred from rehab to the long-term part of the facility, Hospice follow up.  LABS AND IMAGING STUDIES WHILE IN THE HOSPITAL: 1.  Sodium 132, potassium 4.0, chloride 91, bicarb 36, BUN 70, creatinine 1.8, glucose 149, calcium of 8.7.  2.  Ultrasound, kidneys bilaterally, showing extremely poor quality of the study  because of a moderate amount of ascites. But the kidneys do look normal.  3.  Hemoglobin is 11.3, hematocrit 34.2, platelet count 170. WBC was 14. 4.  Blood cultures negative. 4.  Ultrasound Dopplers, bilateral lower extremities, showing right common femoral profunda vein, superficial femoral and popliteal  venous thrombosis. Also a thrombus and near totally occluded throughout the deep venous system in the right lower extremity with a large clot burden. No evidence of DVT in the left lower extremity. 5.  CT of the chest without contrast showing a small, moderate right pleural effusion with compressive atelectasis, left basilar airspace disease, patchy bilateral interstitial opacities concerning for mild edema, moderate abdominal ascites. Chest x-ray showing pleural effusions, bibasilar atelectasis versus pneumonia, asymmetric edema.   BRIEF HOSPITAL COURSE:  Taylor Fox is an 81 year old elderly female with past medical history significant for hypertension, chronic atrial fibrillation, who is not on any anticoagulation, diastolic congestive heart failure, was recently diagnosed with a large left ovarian mass with elevated CU-125, likely ovarian cancer; however, has not been a candidate for surgery for staging of the disease and further diagnosis. She also has underlying dementia, who had a prolonged hospital course in 10/2012 and was discharged the [facility], was brought in secondary to acute respiratory failure with hypoxia.   1.  Acute respiratory failure with hypoxia. Chest x-ray showing small pleural effusions and bibasilar airspace disease could be pneumonia. Since the patient was from a rehab facility, it could have been healthcare-acquired pneumonia. Blood cultures  were negative. She was placed on oxygen. Her mental status has improved and she was initially on broad spectrum antibiotics with Vanc, Zosyn and Levaquin that were changed over to Levaquin prior to discharge once the  cultures were negative. The patient does have poor prognosis. She is not rehabable at this time because of her ascites and generalized weakness and overall clinical decline. So Palliative Care has seen the patient while in the hospital. She is in the rehab part of the facility, will be moved over to the long-term care soon and then Hospice can follow up there. The patient is already a DO NOT RESUSCITATE. The patient will be moved from [facility] to [facility] because of its proximity to patient's family.  2.  Acute renal failure. Since admission to the hospital, she could not get a lot of IV fluids because of her worsening ascites and lower extremity edema and her diastolic CHF. She was encouraged to take more p.o. fluids and had sodium chloride at extremely low rate and that improved her kidney to 1.8 and she had extremely slow improving renal function. Renal ultrasound was done; however, it was a poor quality ultrasound due to ascites masking most of the quadrants but her kidneys seem to be doing okay. This has to be watched as an outpatient. Not sure if her ovarian mass on the left side is compressing on the left ureter but no hydronephrosis was seen anyways for now. So this can be monitored as an outpatient.  3.  Right lower extremity DVT. Extensive clot burden in the right lower extremity noted. She was started on IV heparin. This could have been secondary to her ovarian cancer. She is again an extremely poor candidate for any procedure at this time. She was changed over to Eliquis at the time of discharge for her DVT. 4.  Chronic A-fib. Rate seems to be well-controlled currently on anticoagulation.  5.  Dementia. She is pleasant, but oriented to self and confused at baseline.  6.  Overall course has been otherwise uneventful in the hospital.  DISCHARGE CONDITION:  Guarded with long-term poor prognosis.  DISCHARGE DISPOSITION:  To [facility] facility.  TIME SPENT ON DISCHARGE:   40 minutes.   CODE STATUS:  DO NOT RESUSCITATE.  ____________________________ Enid Baasadhika Aubrina Nieman, MD rk:jm D: 12/02/2012 11:04:45 ET T: 12/02/2012 11:21:34 ET JOB#: [id number]  cc: Enid Baasadhika Jameika Kinn, MD, <Dictator> Enid BaasADHIKA Tilla Wilborn MD ELECTRONICALLY SIGNED 12/05/2012 18:22

## 2014-05-03 NOTE — Discharge Summary (Signed)
PATIENT NAME:  Taylor DownerFLORENCE, Taylor Fox MR#:  [id number] DATE OF BIRTH:  1929-02-19  DATE OF ADMISSION:  10/19/2012 DATE OF DISCHARGE:  10/25/2012  ADMITTING PHYSICIAN:  Dr. Delfino LovettVipul Shah  DISCHARGING PHYSICIAN:  Dr. Enid Baasadhika Bawi Lakins   PRIMARY CARE PHYSICIAN:  Dr. Ruffin Fox  CONSULTATIONS IN THE HOSPITAL:  1.  OB/GYN consultation by Dr. Johnsie Cancelhomas Schemerhorn. 2.  OB/GYN oncology consultation by Dr. Wendy PoetBrigitte Miller.    DISCHARGE DIAGNOSES: 1.  Acute renal failure. 2.  Dementia.  3.  Chronic atrial fibrillation and sick sinus syndrome, status post pacemaker placement. 4.  Atherosclerotic cardiovascular disease.  5.  Peripheral neuropathy.  6.  Large right ovarian complex cystic mass with elevated CA-125. 7.  Aortic valve replacement with a bovine valve. 8.  Degenerative joint disease with right hip pain.  DISCHARGE HOME MEDICATIONS:  1.  Multivitamin 1 tablet p.o. daily.  2.  Dulcolax 10 mg per rectum daily p.r.n. for constipation.  3.  Vitamin C 500 mg p.o. b.i.d.  4.  Cranberry oral capsule daily.  5.  Fish oil 1200 mg p.o. daily.  6.  Gabapentin 300 mg p.o. b.i.d.  7.  Namenda 5 mg p.o. daily. 8.  Tramadol 50 mg p.o. q. 6 hours p.r.n. for pain.  9.  Zofran 4 mg p.o. ODT, q. 6 hours p.r.n. for nausea.  DISCHARGE DIET:  Regular diet.   DISCHARGE ACTIVITY:  As tolerated.  FOLLOWUP INSTRUCTIONS: 1.  Follow up with [facility] next week for the right ovarian mass.  2.  PCP followup in 2 weeks.  3.  Physical therapy.   LABORATORIES AND IMAGING STUDIES PRIOR TO DISCHARGE: Sodium 138, potassium 4.4, chloride 104, bicarb 29, BUN 13, creatinine 0.75, glucose 86 and calcium of 9.0. WBC 8.2, hemoglobin 10.6, hematocrit 30.9, platelet count 202.  Pelvic ultrasound showing large complex cystic ovarian mass concerning for right ovarian malignancy.  CT of the head without contrast showing chronic changes without evidence of acute abnormalities.  Urinalysis negative for any infection.  CA-125 elevated at 468.2.  Chest x-ray revealing clear lung fields. No acute abnormality.  CT of the abdomen, pelvis and chest showing 14 x 15 cm complex right ovarian mass most concerning for ovarian malignancy and small amount of abdominal and pelvic free fluid present.   BRIEF HOSPITAL COURSE: Ms. Taylor FriedlanderFlorence is an 81 year old elderly Caucasian female with past medical history significant for mild dementia, chronic a-fib, sick sinus syndrome status post pacemaker, aortic valve replacement, hypertension who lives at home with her elderly husband was brought to the hospital secondary to dehydration, weakness and abdominal pain. CT of the abdomen done prior to admission revealed a large complex cystic right ovarian mass concerning for malignancy.  1.  Large right ovarian cystic mass concerning for possible ovarian malignancy with elevated CA-125 level.  She was seen  by OB/GYN physician who referred her to see a GYN oncologist and the patient was seen by Dr[facility] Wendy PoetBrigitte Miller [facility]. But Dr. Hyacinth MeekerMiller felt that the patient is high risk for surgery and only way to get either symptomatic relief for staging for her ovarian cancer would be surgery. She recommended that the patient be referred to a tertiary care center and [facility] was being called upon and they recommended outpatient followup; so that appointment will be set up prior to discharge. 2.  Acute renal failure, likely prerenal and acute tubular necrosis prior to admission. The patient was on diuretics at home secondary to lower extremity edema  and abdominal bloating. Creatinine was 2.4 on admission. With IV fluids, creatinine normalized at this time. She still has 2+ lower extremity edema but is not being discharged on any diuretics at this time.  3. Chronic atrial fibrillation and sick sinus syndrome, status post pacemaker, appears stable. Continue taking aspirin.  4.  Peripheral neuropathy. She is on gabapentin. 5.   Mild dementia. The patient on Namenda for the same. 6. The patient did work with physical therapy and due to generalized weakness, they recommended short-term rehab. The patient is being discharged to rehab today.   DISCHARGE CONDITION:  Guarded.   DISCHARGE DISPOSITION:  To short-term rehab.  TIME SPENT ON DISCHARGE:  45 minutes.  Note- patients appointment has been set up with [facility] for October 24th at [facility].   ____________________________ Enid Baas, MD rk:ce D: 10/25/2012 13:27:22 ET T: 10/25/2012 13:42:00 ET JOB#: [id number]  cc: Enid Baas, MD, <Dictator> [facility] Enid Baas MD ELECTRONICALLY SIGNED 10/28/2012 12:09
